# Patient Record
Sex: Male | Born: 1975 | Race: Black or African American | Hispanic: No | Marital: Single | Smoking: Current every day smoker
Health system: Southern US, Community
[De-identification: ages and names within clinical notes are randomized; demographics above are authoritative.]

## PROBLEM LIST (undated history)

## (undated) DIAGNOSIS — B20 Human immunodeficiency virus [HIV] disease: Secondary | ICD-10-CM

## (undated) HISTORY — PX: ANKLE SURGERY: SHX546

---

## 1999-08-10 ENCOUNTER — Emergency Department (HOSPITAL_COMMUNITY): Admission: EM | Admit: 1999-08-10 | Discharge: 1999-08-10 | Payer: Self-pay | Admitting: Emergency Medicine

## 2006-03-20 ENCOUNTER — Emergency Department (HOSPITAL_COMMUNITY): Admission: EM | Admit: 2006-03-20 | Discharge: 2006-03-20 | Payer: Self-pay | Admitting: Emergency Medicine

## 2006-03-22 ENCOUNTER — Emergency Department (HOSPITAL_COMMUNITY): Admission: EM | Admit: 2006-03-22 | Discharge: 2006-03-22 | Payer: Self-pay | Admitting: Diagnostic Radiology

## 2006-06-26 ENCOUNTER — Emergency Department (HOSPITAL_COMMUNITY): Admission: EM | Admit: 2006-06-26 | Discharge: 2006-06-26 | Payer: Self-pay | Admitting: Emergency Medicine

## 2006-09-19 ENCOUNTER — Emergency Department (HOSPITAL_COMMUNITY): Admission: EM | Admit: 2006-09-19 | Discharge: 2006-09-19 | Payer: Self-pay | Admitting: Emergency Medicine

## 2007-08-24 ENCOUNTER — Emergency Department (HOSPITAL_COMMUNITY): Admission: EM | Admit: 2007-08-24 | Discharge: 2007-08-24 | Payer: Self-pay | Admitting: Emergency Medicine

## 2007-12-03 ENCOUNTER — Emergency Department (HOSPITAL_COMMUNITY): Admission: EM | Admit: 2007-12-03 | Discharge: 2007-12-03 | Payer: Self-pay | Admitting: Emergency Medicine

## 2008-03-15 ENCOUNTER — Emergency Department (HOSPITAL_COMMUNITY): Admission: EM | Admit: 2008-03-15 | Discharge: 2008-03-15 | Payer: Self-pay | Admitting: Emergency Medicine

## 2008-05-20 ENCOUNTER — Emergency Department (HOSPITAL_COMMUNITY): Admission: EM | Admit: 2008-05-20 | Discharge: 2008-05-20 | Payer: Self-pay | Admitting: Emergency Medicine

## 2008-06-27 ENCOUNTER — Emergency Department (HOSPITAL_COMMUNITY): Admission: EM | Admit: 2008-06-27 | Discharge: 2008-06-27 | Payer: Self-pay | Admitting: Emergency Medicine

## 2008-08-24 ENCOUNTER — Emergency Department (HOSPITAL_COMMUNITY): Admission: EM | Admit: 2008-08-24 | Discharge: 2008-08-25 | Payer: Self-pay | Admitting: Internal Medicine

## 2008-08-27 ENCOUNTER — Emergency Department (HOSPITAL_COMMUNITY): Admission: EM | Admit: 2008-08-27 | Discharge: 2008-08-27 | Payer: Self-pay | Admitting: Emergency Medicine

## 2008-10-13 ENCOUNTER — Emergency Department (HOSPITAL_COMMUNITY): Admission: EM | Admit: 2008-10-13 | Discharge: 2008-10-13 | Payer: Self-pay | Admitting: Emergency Medicine

## 2010-07-27 ENCOUNTER — Emergency Department (HOSPITAL_COMMUNITY): Payer: Self-pay

## 2010-07-27 ENCOUNTER — Inpatient Hospital Stay (HOSPITAL_COMMUNITY)
Admission: EM | Admit: 2010-07-27 | Discharge: 2010-07-29 | DRG: 494 | Disposition: A | Discharge status: Home / Self Care | Payer: No Typology Code available for payment source | Attending: Orthopedic Surgery | Admitting: Orthopedic Surgery

## 2010-07-27 DIAGNOSIS — Z7982 Long term (current) use of aspirin: Secondary | ICD-10-CM

## 2010-07-27 DIAGNOSIS — W138XXA Fall from, out of or through other building or structure, initial encounter: Secondary | ICD-10-CM | POA: Diagnosis present

## 2010-07-27 DIAGNOSIS — F1210 Cannabis abuse, uncomplicated: Secondary | ICD-10-CM | POA: Diagnosis present

## 2010-07-27 DIAGNOSIS — F17200 Nicotine dependence, unspecified, uncomplicated: Secondary | ICD-10-CM | POA: Diagnosis present

## 2010-07-27 DIAGNOSIS — S93439A Sprain of tibiofibular ligament of unspecified ankle, initial encounter: Secondary | ICD-10-CM | POA: Diagnosis present

## 2010-07-27 DIAGNOSIS — Z21 Asymptomatic human immunodeficiency virus [HIV] infection status: Secondary | ICD-10-CM | POA: Diagnosis present

## 2010-07-27 DIAGNOSIS — Y9289 Other specified places as the place of occurrence of the external cause: Secondary | ICD-10-CM

## 2010-07-27 DIAGNOSIS — S8253XA Displaced fracture of medial malleolus of unspecified tibia, initial encounter for closed fracture: Principal | ICD-10-CM | POA: Diagnosis present

## 2010-07-27 DIAGNOSIS — F1010 Alcohol abuse, uncomplicated: Secondary | ICD-10-CM | POA: Diagnosis present

## 2010-07-27 LAB — URINALYSIS, ROUTINE W REFLEX MICROSCOPIC
Bilirubin Urine: NEGATIVE
Glucose, UA: NEGATIVE mg/dL
Hgb urine dipstick: NEGATIVE
Ketones, ur: NEGATIVE mg/dL
Nitrite: NEGATIVE
Protein, ur: 100 mg/dL — AB
Specific Gravity, Urine: 1.013 (ref 1.005–1.030)
Urobilinogen, UA: 0.2 mg/dL (ref 0.0–1.0)
pH: 6 (ref 5.0–8.0)

## 2010-07-27 LAB — RAPID URINE DRUG SCREEN, HOSP PERFORMED
Amphetamines: NOT DETECTED
Barbiturates: NOT DETECTED
Benzodiazepines: NOT DETECTED
Cocaine: NOT DETECTED
Opiates: NOT DETECTED
Tetrahydrocannabinol: POSITIVE — AB

## 2010-07-27 LAB — CBC
HCT: 37.2 % — ABNORMAL LOW (ref 39.0–52.0)
Hemoglobin: 13.3 g/dL (ref 13.0–17.0)
MCH: 31.6 pg (ref 26.0–34.0)
MCHC: 35.8 g/dL (ref 30.0–36.0)
MCV: 88.4 fL (ref 78.0–100.0)
Platelets: 243 K/uL (ref 150–400)
RBC: 4.21 MIL/uL — ABNORMAL LOW (ref 4.22–5.81)
RDW: 12.9 % (ref 11.5–15.5)
WBC: 5.9 K/uL (ref 4.0–10.5)

## 2010-07-27 LAB — POCT I-STAT, CHEM 8
BUN: 6 mg/dL (ref 6–23)
Calcium, Ion: 1.12 mmol/L (ref 1.12–1.32)
Creatinine, Ser: 1.3 mg/dL (ref 0.4–1.5)
Glucose, Bld: 104 mg/dL — ABNORMAL HIGH (ref 70–99)
Sodium: 142 mEq/L (ref 135–145)
TCO2: 21 mmol/L (ref 0–100)

## 2010-07-27 LAB — COMPREHENSIVE METABOLIC PANEL
ALT: 21 U/L (ref 0–53)
AST: 31 U/L (ref 0–37)
CO2: 22 mEq/L (ref 19–32)
Calcium: 9.5 mg/dL (ref 8.4–10.5)
Chloride: 102 mEq/L (ref 96–112)
Creatinine, Ser: 0.99 mg/dL (ref 0.4–1.5)
GFR calc non Af Amer: >60 mL/min (ref >60)
Total Bilirubin: 0.4 mg/dL (ref 0.3–1.2)

## 2010-07-27 LAB — ABO/RH: ABO/RH(D): A POS

## 2010-07-27 LAB — TYPE AND SCREEN: Antibody Screen: NEGATIVE

## 2010-07-27 LAB — URINE MICROSCOPIC-ADD ON

## 2010-07-27 LAB — PROTIME-INR: Prothrombin Time: 13.3 seconds (ref 11.6–15.2)

## 2010-07-27 LAB — ETHANOL: Alcohol, Ethyl (B): 138 mg/dL — ABNORMAL HIGH (ref 0–10)

## 2010-07-27 LAB — LACTIC ACID, PLASMA: Lactic Acid, Venous: 4.9 mmol/L — ABNORMAL HIGH (ref 0.5–2.2)

## 2010-07-28 LAB — BASIC METABOLIC PANEL
CO2: 28 mEq/L (ref 19–32)
Calcium: 9.3 mg/dL (ref 8.4–10.5)
Creatinine, Ser: 0.74 mg/dL (ref 0.4–1.5)
GFR calc Af Amer: >60 mL/min (ref >60)
GFR calc non Af Amer: >60 mL/min (ref >60)
Glucose, Bld: 100 mg/dL — ABNORMAL HIGH (ref 70–99)

## 2010-07-28 LAB — CBC
MCH: 31.1 pg (ref 26.0–34.0)
RDW: 13 % (ref 11.5–15.5)
WBC: 7.4 10*3/uL (ref 4.0–10.5)

## 2010-07-28 LAB — SURGICAL PCR SCREEN: Staphylococcus aureus: POSITIVE — AB

## 2010-07-29 NOTE — H&P (Signed)
  NAMEKEKAI, GETER NO.:  [id number]  MEDICAL RECORD NO.:  [id number]           PATIENT TYPE:  I  LOCATION:  5019                         FACILITY:  [facility]  PHYSICIAN:  Madlyn Frankel. Charlann Boxer, M.D.  DATE OF BIRTH:  15-Oct-1975  DATE OF ADMISSION:  07/27/2010 DATE OF DISCHARGE:                             HISTORY & PHYSICAL   ADMISSION DIAGNOSIS:  Right ankle fracture dislocation.  ADMITTING HISTORY:  Mr. Geiger is a 35 year old male with HIV positive who was out tonight.  He apparently slipped on something while on an overpass, stumbled and fell off, reportedly fallen about 20 feet.  He landed on his feet apparently with complaints of ankle pain predominantly.  He did not have any significant head injury.  No loss of conscious.  He had obvious deformity.  EMS brought him to the emergency room for evaluation.  He reports having only just a couple of drinks.  PAST MEDICAL HISTORY:  HIV positive.  FAMILY HISTORY:  Negative for diabetes, hypertension.  SURGICAL HISTORY:  Negative.  SOCIAL HISTORY:  Positive for tobacco use, cannabis use, and occasional alcohol use.  DRUG ALLERGIES:  SHELLFISH and IODINE.  CURRENT MEDICATIONS:  Bactrim, Valtrex, Atripla.  REVIEW OF SYSTEMS:  Other than reported musculoskeletal issues, he has had no recent headache, dizziness, blurred vision, slurred speech, no neurologic complaints.  He has had no rhinitis, productive cough, hemoptysis.  No fevers, chills, or night sweats.  No chest pain.  No abdominal pain.  No burning, frequency in urination.  PHYSICAL EXAMINATION:  Mr. Nack was seen and evaluated in the emergency room.  At that time, he had had an attempted reduction of his ankle; however, he was in his splint.  Evaluating the postreduction films initially, noted that his ankle was still dislocated.  His splint was taken down.  He had small abrasion over the anterolateral right leg, but no cuts or abrasions around his  ankle.  He had palpable pulses and intact sensibility.  He seemed to tolerate his pain level.  At this point, it is fine.  His left lower extremity is normal.  No evidence of any injuries.  He had no pain in his knees or hips and no upper extremity pathology despite this fall.  Radiographs initially noted to have a significant ankle fracture and dislocation with probable syndesmotic disruption.  ASSESSMENT:  Closed right ankle fracture and dislocation.  PLAN:  In the emergency room, he had a repeat closed reduction over the right ankle with application of splint with follow up radiographs.  Plan will be to admit to the hospital and scheduled for surgery over the weekend.  He will need an open reduction and internal fixation of his right ankle.  Consent will be obtained reviewing the necessity of the procedure with him.  Questions were encouraged and answered.     Madlyn Frankel Charlann Boxer, M.D.     MDO/MEDQ  D:  07/27/2010  T:  07/27/2010  Job:  [id number]  Electronically Signed by Durene Romans M.D. on 07/29/2010 12:15:15 PM

## 2010-07-29 NOTE — Op Note (Signed)
[name], [name] NO.:  [id number]  MEDICAL RECORD NO.:  [id number]           PATIENT TYPE:  I  LOCATION:  5019                         FACILITY:  [facility]  PHYSICIAN:  Madlyn Frankel. Charlann Boxer, M.D.  DATE OF BIRTH:  10-24-1975  DATE OF PROCEDURE:  07/28/2010 DATE OF DISCHARGE:                              OPERATIVE REPORT   PREOPERATIVE DIAGNOSIS:  Right ankle fracture, dislocation with syndesmotic disruption.  POSTOPERATIVE DIAGNOSIS:  Right ankle fracture, dislocation with syndesmotic disruption.  PROCEDURES: 1. Open reduction and internal fixation of right ankle. 2. Open reduction internal rotation of syndesmotic injury utilizing     DePuy components.  SURGEON:  Madlyn Frankel. Charlann Boxer, MD  ASSISTANT:  Surgical team.  ANESTHESIA:  General LMA.  TOURNIQUET TIME:  85 minutes, 250 mmHg.  DRAINS:  None.  SPECIMENS:  None.  The patient was stable to recovery room.  INDICATION FOR THE PROCEDURE:  Leon Hall is a 35 year old male who injured his right ankle and was admitted to the hospital on July 27, 2010.  He was noted to have a fracture, dislocation of the right ankle with concern for disruption and syndesmosis.  He was planned for surgery on July 28, 2010.  Risks and benefits and necessity of fracture fixation were discussed as well as a postoperative course.  PROCEDURE IN DETAIL:  The patient was brought to the operative theater. Once adequate anesthesia, preoperative antibiotics, Ancef, administered, the patient was positioned supine with the right thigh tourniquet placed.  The right lower extremity was then prepped and draped in a sterile fashion utilizing noniodine-based products due to an IODINE allergy.  Time-out was performed identifying the patient, planned procedure, and extremity.  Leg was exsanguinated, tourniquet elevated to 250 mmHg.  Attention was first directed laterally.  The lateral-based incision was made and carried down sharply to the  lateral fibula. Fracture site was identified.  The patient was noted to have an oblique fracture pattern to the fibula with a posterior extension of a fragment. Utilizing clamps, the fracture was reduced to a near anatomic position. Based on the oblique nature, I at this point chose to use a lag screw technique.  With the fracture reduced, a 3.5 drill bit was first used on the anterior cortex in a direction perpendicular to fracture site.  A 2.7 drill bit was then passed through this to the posterior cortex.  I chose to use a 28-mm full cancellus screw to provide the lag.  With the fracture now reduced and held with a lag screw, a six-hole one- third tubular plate was chosen as a buttress to the fixation.  With the plate held against the fibula laterally, I placed two cancellous screws distal to the fracture pattern as well as three bicortical screws initially proximally.  The lateral fibula at this point was held anatomic.  At this point, attention was directed laterally.  A soft J-type incision was carried over the fracture site on the medial malleolus.  Fracture pattern was identified and noted some comminution along the medial aspect of the fracture site.  Under fluoroscopy, I have reduced the fracture to near anatomic  position.  Based on the type of the pattern that was present, as well as the size of bone, I was only able to get a single screw in.  I used a 2.7 drill bit and drilled through the apex of the fracture site across the fracture into the proximal medial tibia.  I chose a 45-mm partially threaded cancellous screw which was then passed through the fracture site holding the fracture into near anatomic position.  The medial comminuted segment was small enough it did not require fixation.  At this point, I assessed the syndesmosis.  I was worried about lateral subluxation of the talus as well as an anterior-posterior shuck of the fixed fibula.  For this reason, I opened  up a Israel clamp and held the ankle in a reduced fashion.  Based on the placement of the previous lag screw in the distal portion of the plate, I was only able to place a single screw, I chose to use a 4.0 fully cancellus screw.  With the syndesmosis reduced, I drilled with a 2.7 drill bit across the fibula and tibia.  I confirmed the positioning and placed a 48-mm screw.  The syndesmosis and fracture now are held in a near anatomic position.  Final pictures were obtained.  The wounds were irrigated with normal saline solution.  The lateral wound was closed in layers with the deeper layer held together with 2-0 Vicryl, subcu layer with 2-0 Vicryl, and then 2-0 nylon on the skin.  Medially, I reapproximated some of the periosteal tissue over the fracture site holding the medial comminuted segment into anatomic position.  The remaining wound was closed with 2-0 Vicryl and 2-0 nylon.  The wounds were then cleaned, dried, and dressed sterilely with an Adaptic and a bulky wrap, followed by the placement of plaster splint.  He was then extubated and brought to the recovery room in stable condition tolerating the procedure well.     Madlyn Frankel Charlann Boxer, M.D.     MDO/MEDQ  D:  07/28/2010  T:  07/28/2010  Job:  [id number]  Electronically Signed by Durene Romans M.D. on 07/29/2010 12:15:19 PM

## 2010-08-03 NOTE — Discharge Summary (Signed)
[name], HOCKEY NO.:  [id number]  MEDICAL RECORD NO.:  [id number]           PATIENT TYPE:  I  LOCATION:  5019                         FACILITY:  [facility]  PHYSICIAN:  Madlyn Frankel. Charlann Boxer, M.D.  DATE OF BIRTH:  1975/10/15  DATE OF ADMISSION:  07/27/2010 DATE OF DISCHARGE:  07/29/2010                              DISCHARGE SUMMARY   ADMITTING DIAGNOSIS:  Right ankle fracture dislocation with syndesmotic disruption.  DISCHARGE DIAGNOSES: 1. Right ankle fracture dislocation with syndesmotic disruption status     post open reduction and internal fixation. 2. Human immunodeficiency virus positive.  BRIEF HISTORY:  Mr. Leon Hall is a 35 year old male who injured his right ankle with an uncertain history as it most of it changed a few different times, but it may involve slipping on a glass bottle, may or may not have involved falling over past 20 feet and nonetheless, his only injury was determined to be his ankle fracture.  This is a closed fracture dislocation of his right ankle.  He was seen and evaluated in the emergency room, admitted to the hospital due to the temple nature of it, kept comfortable overnight and planned for surgery on July 28, 2010.  HOSPITAL COURSE:  The patient was admitted to the hospital on July 27, 2010, for his right ankle fracture dislocation.  He was seen and evaluated in the emergency room where his fracture was closed, reduced and placed in splint.  Consent was obtained for surgery based on risks and benefits and necessity of the surgery is reviewed.  On hospital day 2 or July 28, 2010, he underwent an open reduction and internal fixation of his right ankle and open reduction and internal fixation of syndesmosis.  Please see the dictated operative note for full details of the procedure.  Postoperatively, he was transferred to the recovery room and then the orthopedic ward where he remained for his overnight stay.  Mr. Crunkleton seems to  tolerate pain pretty well and by postop day #1, was seen and evaluated by Therapy and cleared on crutches.  He was awaiting a repeat effort with therapy to work on stairs, but otherwise he is ready for discharge.  DISCHARGE INSTRUCTIONS:  The patient will be discharged to home.  He will be nonweightbearing on the right lower extremity for 6 weeks is reviewed.  He will keep his splint dry without getting wet until followup in 2 weeks.  He will most likely be transitioned from the splint to a short-leg cast.  DISCHARGE MEDICATIONS:  Include his home medications of: 1. Valtrex 1 gram nightly. 2. Bactrim DS nightly. 3. Atripla 1 tablet daily.  In addition, I gave him a prescription for: 1. Hydrocodone 5/325 one-two tablets every 4-6 hours as needed for     pain. 2. Robaxin 500 mg every 6 hours as needed for pain. 3. Aspirin 325 mg, he will take for 30 days. 4. Colace 100 mg p.o. b.i.d. as needed for constipation while on pain     meds.  If he has any questions and also to arrange for orthopedic followup with Dr. Durene Romans at [facility], office number [phone].     Madlyn Frankel Charlann Boxer, M.D.     MDO/MEDQ  D:  07/29/2010  T:  07/30/2010  Job:  [id number]  Electronically Signed by Durene Romans M.D. on 08/03/2010 09:59:27 AM

## 2011-01-28 LAB — URINALYSIS, ROUTINE W REFLEX MICROSCOPIC
Glucose, UA: NEGATIVE
Ketones, ur: NEGATIVE
pH: 7

## 2011-01-28 LAB — COMPREHENSIVE METABOLIC PANEL
Alkaline Phosphatase: 81
BUN: 3 — ABNORMAL LOW
CO2: 28
Chloride: 104
GFR calc non Af Amer: >60
Glucose, Bld: 86
Potassium: 3.9
Total Bilirubin: 0.7
Total Protein: 7.4

## 2011-01-28 LAB — CBC
HCT: 43.5
Hemoglobin: 14.9
RDW: 12.6

## 2011-01-28 LAB — DIFFERENTIAL
Basophils Absolute: 0.3 — ABNORMAL HIGH
Basophils Relative: 3 — ABNORMAL HIGH
Neutro Abs: 5.8
Neutrophils Relative %: 71

## 2011-01-28 LAB — LIPASE, BLOOD: Lipase: 22

## 2011-05-09 ENCOUNTER — Encounter (HOSPITAL_COMMUNITY): Payer: Self-pay

## 2011-05-09 ENCOUNTER — Emergency Department (HOSPITAL_COMMUNITY)
Admission: EM | Admit: 2011-05-09 | Discharge: 2011-05-09 | Disposition: A | Discharge status: Home / Self Care | Payer: Self-pay | Attending: Emergency Medicine | Admitting: Emergency Medicine

## 2011-05-09 DIAGNOSIS — L0201 Cutaneous abscess of face: Secondary | ICD-10-CM | POA: Insufficient documentation

## 2011-05-09 DIAGNOSIS — Z21 Asymptomatic human immunodeficiency virus [HIV] infection status: Secondary | ICD-10-CM | POA: Insufficient documentation

## 2011-05-09 DIAGNOSIS — L03211 Cellulitis of face: Secondary | ICD-10-CM | POA: Insufficient documentation

## 2011-05-09 DIAGNOSIS — K029 Dental caries, unspecified: Secondary | ICD-10-CM | POA: Insufficient documentation

## 2011-05-09 HISTORY — DX: Human immunodeficiency virus (HIV) disease: B20

## 2011-05-09 MED ORDER — DOXYCYCLINE HYCLATE 100 MG PO CAPS: 100.0000 mg | ORAL_CAPSULE | Freq: Two times a day (BID) | ORAL | Status: AC

## 2011-05-09 NOTE — ED Provider Notes (Signed)
Medical screening examination/treatment/procedure(s) were performed by non-physician practitioner and as supervising physician I was immediately available for consultation/collaboration.  Doug Sou, MD 05/09/11 2150

## 2011-05-09 NOTE — ED Provider Notes (Signed)
History     CSN: [id number]  Arrival date & time 05/09/11  1406   First MD Initiated Contact with Patient 05/09/11 1630      Chief Complaint  Patient presents with  . Recurrent Skin Infections    boil to RT cheek x 1 year, comes and goes.      (Consider location/radiation/quality/duration/timing/severity/associated sxs/prior Treatment) Patient is a 36 y.o. male presenting with abscess. The history is provided by the patient.  Abscess  This is a recurrent problem. The current episode started more than one week ago. The onset was gradual. The problem occurs occasionally. The problem has been gradually worsening. Affected Location: right cheek. The problem is mild. The abscess is characterized by redness and swelling. Pertinent negatives include no fever and no sore throat.   patient reports a draining skin wound to the same location approximately 1 year ago that resolved and has gradually worsened in the last several months, though he describes a rapid worsening in the last week. History of abscesses to the axilla. Denies similar lesions to any other part of the body at this time.  Past Medical History  Diagnosis Date  . HIV disease     Past Surgical History  Procedure Date  . Ankle surgery     RT ankle    History reviewed. No pertinent family history.  History  Substance Use Topics  . Smoking status: Current Everyday Smoker -- 0.2 packs/day    Types: Cigarettes  . Smokeless tobacco: Not on file  . Alcohol Use: Yes     occasionally      Review of Systems  Constitutional: Negative for fever and chills.  HENT: Negative for sore throat, trouble swallowing, neck pain and neck stiffness.        Negative for tooth pain. Negative for mouth lesion.  Eyes: Negative for pain and visual disturbance.  Skin: Positive for wound. Negative for color change and rash.  Neurological: Negative for dizziness and headaches.    Allergies  Shellfish allergy  Home Medications    Current Outpatient Rx  Name Route Sig Dispense Refill  . EFAVIRENZ-EMTRICITAB-TENOFOVIR 600-200-300 MG PO TABS Oral Take 1 tablet by mouth at bedtime.    Marland Kitchen VALACYCLOVIR HCL 1 G PO TABS Oral Take 1,000 mg by mouth daily.      BP 125/87  Pulse 71  Temp(Src) 98.8 F (37.1 C) (Oral)  Resp 16  SpO2 100%  Physical Exam  Nursing note and vitals reviewed. Constitutional: He is oriented to person, place, and time. He appears well-developed and well-nourished. No distress.  HENT:  Head: Normocephalic and atraumatic.  Right Ear: External ear normal.  Left Ear: External ear normal.  Mouth/Throat: Oropharynx is clear and moist and mucous membranes are normal. Dental caries present. No dental abscesses or uvula swelling.       Teeth in poor repair. Oral mucosa pink and moist without lesions. See skin exam  Eyes: Pupils are equal, round, and reactive to light.  Neck: Normal range of motion. Neck supple.  Cardiovascular: Normal rate and regular rhythm.   Pulmonary/Chest:       Normal respiratory effort and excursion  Abdominal: Soft. He exhibits no distension. There is no tenderness.  Musculoskeletal: Normal range of motion. He exhibits no edema and no tenderness.  Neurological: He is alert and oriented to person, place, and time.  Skin: Skin is warm and dry. No rash noted.       1.5cm abscess to right upper cheek with erythema, induration,  2mm area of fluctuance to superior aspect. Mildly TTP. No surrounding cellulitis.  Psychiatric: He has a normal mood and affect.    ED Course  INCISION AND DRAINAGE Date/Time: 05/09/2011 5:15 PM Performed by: Lorenz Coaster JUSTICE Authorized by: Lorenz Coaster JUSTICE Consent: Verbal consent obtained. Risks and benefits: risks, benefits and alternatives were discussed Consent given by: patient Patient understanding: patient states understanding of the procedure being performed Patient consent: the patient's understanding of the procedure  matches consent given Relevant documents: relevant documents present and verified Site marked: the operative site was marked Patient identity confirmed: verbally with patient Time out: Immediately prior to procedure a "time out" was called to verify the correct patient, procedure, equipment, support staff and site/side marked as required. Type: abscess Body area: head/neck (right cheek) Anesthesia method: no anesthesia used. Patient sedated: no Needle gauge: 18 Drainage: purulent Drainage amount: moderate Wound treatment: wound left open Packing material: none Patient tolerance: Patient tolerated the procedure well with no immediate complications.   (including critical care time)  Labs Reviewed - No data to display No results found.   Dx 1: abscess, face   MDM  Needle drainage of abscess to right cheek with purulent drainage. Given immunocompromised status of pt, will place on abx and have advised warm compresses several times a day as no packing used. Pt instructed to f/u with pcp        Shaaron Adler, PA 05/09/11 1744

## 2012-08-04 ENCOUNTER — Encounter (HOSPITAL_COMMUNITY): Payer: Self-pay | Admitting: Emergency Medicine

## 2012-08-04 ENCOUNTER — Emergency Department (HOSPITAL_COMMUNITY)
Admission: EM | Admit: 2012-08-04 | Discharge: 2012-08-04 | Disposition: A | Discharge status: Home / Self Care | Payer: Self-pay | Attending: Emergency Medicine | Admitting: Emergency Medicine

## 2012-08-04 ENCOUNTER — Emergency Department (HOSPITAL_COMMUNITY): Payer: Self-pay

## 2012-08-04 DIAGNOSIS — J069 Acute upper respiratory infection, unspecified: Secondary | ICD-10-CM

## 2012-08-04 DIAGNOSIS — R1110 Vomiting, unspecified: Secondary | ICD-10-CM | POA: Insufficient documentation

## 2012-08-04 DIAGNOSIS — R05 Cough: Secondary | ICD-10-CM

## 2012-08-04 DIAGNOSIS — R071 Chest pain on breathing: Secondary | ICD-10-CM | POA: Insufficient documentation

## 2012-08-04 DIAGNOSIS — R059 Cough, unspecified: Secondary | ICD-10-CM

## 2012-08-04 DIAGNOSIS — R61 Generalized hyperhidrosis: Secondary | ICD-10-CM | POA: Insufficient documentation

## 2012-08-04 DIAGNOSIS — B20 Human immunodeficiency virus [HIV] disease: Secondary | ICD-10-CM | POA: Insufficient documentation

## 2012-08-04 DIAGNOSIS — Z79899 Other long term (current) drug therapy: Secondary | ICD-10-CM | POA: Insufficient documentation

## 2012-08-04 DIAGNOSIS — F17200 Nicotine dependence, unspecified, uncomplicated: Secondary | ICD-10-CM | POA: Insufficient documentation

## 2012-08-04 MED ORDER — HYDROCODONE-HOMATROPINE 5-1.5 MG/5ML PO SYRP: 5.0000 mL | ORAL_SOLUTION | Freq: Four times a day (QID) | ORAL | Status: DC | PRN

## 2012-08-04 MED ORDER — HYDROCOD POLST-CHLORPHEN POLST 10-8 MG/5ML PO LQCR
5.0000 mL | Freq: Once | ORAL | Status: AC
  Administered 2012-08-04: 5 mL via ORAL
  Filled 2012-08-04: qty 5

## 2012-08-04 NOTE — ED Provider Notes (Signed)
History    This chart was scribed for non-physician practitioner, Jaci Carrel working with Nelia Shi, MD by Smitty Pluck, ED scribe. This patient was seen in room WTR5/WTR5 and the patient's care was started at 4:43 PM.   CSN: [id number]  Arrival date & time 08/04/12  1516      Chief Complaint  Patient presents with  . Cough      The history is provided by the patient. No language interpreter was used.   Leon Hall is a 37 y.o. male with hx of HIV who presents to the Emergency Department complaining of cough. He reports having productive cough with clear sputum and  with post tussive regurgitation onset 1 week ago. He mentions that breathing deeply aggravates the chest wall pain. Associated symptoms include night sweats and chest wall tenderness. Pt denies coughing up blood, fever, chills, nausea, vomiting, diarrhea, weakness, congestion, pain swallowing and any other pain. Pt reports that he smokes cigarettes and marijuana. Pt is unsure of his last CD4 count, reports medication compliance.   ID doctor: Cathlean Sauer fores   Past Medical History  Diagnosis Date  . HIV disease     Past Surgical History  Procedure Laterality Date  . Ankle surgery      RT ankle    No family history on file.  History  Substance Use Topics  . Smoking status: Current Every Day Smoker -- 0.20 packs/day    Types: Cigarettes  . Smokeless tobacco: Not on file  . Alcohol Use: Yes     Comment: occasionally      Review of Systems  Constitutional: Positive for diaphoresis. Negative for fever and chills.  Respiratory: Positive for cough and shortness of breath.   Cardiovascular: Positive for chest pain.  Gastrointestinal: Negative for nausea and vomiting.  Neurological: Negative for weakness.  All other systems reviewed and are negative.    Allergies  Shellfish allergy  Home Medications   Current Outpatient Rx  Name  Route  Sig  Dispense  Refill  .  efavirenz-emtrictabine-tenofovir (ATRIPLA) 600-200-300 MG per tablet   Oral   Take 1 tablet by mouth at bedtime.         . valACYclovir (VALTREX) 1000 MG tablet   Oral   Take 1,000 mg by mouth daily.           BP 112/87  Pulse 100  Temp(Src) 99.1 F (37.3 C) (Oral)  Resp 16  SpO2 100%  Physical Exam  Nursing note and vitals reviewed. Constitutional: He is oriented to person, place, and time. He appears well-developed and well-nourished. No distress.  HENT:  Head: Normocephalic and atraumatic.  Poor dentition. Pharynx clear and moist without erythema or exudate  Eyes: Conjunctivae and EOM are normal.  Neck: Normal range of motion.  Cardiovascular:  RRR  Pulmonary/Chest: Effort normal.  LCAB, no distress. Mild chest wall tenderness  Abdominal:  Soft nontender  Musculoskeletal: Normal range of motion.  Neurological: He is alert and oriented to person, place, and time.  Skin: Skin is warm and dry. No rash noted. He is not diaphoretic.  Psychiatric: He has a normal mood and affect. His behavior is normal.    ED Course  Procedures (including critical care time) DIAGNOSTIC STUDIES: Oxygen Saturation is 100% on room air, normal by my interpretation.    COORDINATION OF CARE: 4:47 PM Discussed ED treatment with pt and pt agrees to chest xray.  4:51 PM Ordered:  Medications  chlorpheniramine-HYDROcodone (TUSSIONEX) 10-8 MG/5ML suspension 5 mL (  not administered)       Labs Reviewed - No data to display Dg Chest 2 View  08/04/2012  *RADIOLOGY REPORT*  Clinical Data: Cough and fever  CHEST - 2 VIEW  Comparison: Portal chest 07/27/2010  Findings: The heart size is normal.  The lungs are clear.  The visualized soft tissues and bony thorax are unremarkable.  IMPRESSION: Negative two-view chest.   Original Report Authenticated By: Marin Roberts, M.D.      No diagnosis found.    MDM  37 yo HIV pt (compliant w medication, unknown last CD4)  that is a current  everyday smoker presents to hospital c/o cough onset 1 wk ago. Pt CXR negative for acute infiltrate. Patients symptoms are consistent with URI, likely viral etiology. Discussed that antibiotics are not indicated for viral infections. Pt will be discharged with symptomatic treatment.  Verbalizes understanding and is agreeable with plan. Pt is hemodynamically stable & in NAD prior to dc. Advised ID follow up. Strict return precautions discussed.        I personally performed the services described in this documentation, which was scribed in my presence. The recorded information has been reviewed and is accurate.      Jaci Carrel, [place] 08/04/12 1750

## 2012-08-04 NOTE — ED Notes (Signed)
Pt states that he has been having a dry cough since Sunday.  C/o gen body aches and rib pain from coughing.

## 2012-08-05 NOTE — ED Provider Notes (Signed)
Medical screening examination/treatment/procedure(s) were performed by non-physician practitioner and as supervising physician I was immediately available for consultation/collaboration.   Sanaa Zilberman L Antuane Eastridge, MD 08/05/12 1307 

## 2012-08-06 ENCOUNTER — Encounter (HOSPITAL_COMMUNITY): Payer: Self-pay | Admitting: Emergency Medicine

## 2012-08-06 ENCOUNTER — Emergency Department (HOSPITAL_COMMUNITY)
Admission: EM | Admit: 2012-08-06 | Discharge: 2012-08-06 | Disposition: A | Discharge status: Home / Self Care | Payer: Self-pay | Attending: Emergency Medicine | Admitting: Emergency Medicine

## 2012-08-06 DIAGNOSIS — R0602 Shortness of breath: Secondary | ICD-10-CM | POA: Insufficient documentation

## 2012-08-06 DIAGNOSIS — Z21 Asymptomatic human immunodeficiency virus [HIV] infection status: Secondary | ICD-10-CM | POA: Insufficient documentation

## 2012-08-06 DIAGNOSIS — F17200 Nicotine dependence, unspecified, uncomplicated: Secondary | ICD-10-CM | POA: Insufficient documentation

## 2012-08-06 DIAGNOSIS — R071 Chest pain on breathing: Secondary | ICD-10-CM | POA: Insufficient documentation

## 2012-08-06 DIAGNOSIS — R05 Cough: Secondary | ICD-10-CM | POA: Insufficient documentation

## 2012-08-06 DIAGNOSIS — Z79899 Other long term (current) drug therapy: Secondary | ICD-10-CM | POA: Insufficient documentation

## 2012-08-06 DIAGNOSIS — K029 Dental caries, unspecified: Secondary | ICD-10-CM | POA: Insufficient documentation

## 2012-08-06 DIAGNOSIS — R059 Cough, unspecified: Secondary | ICD-10-CM | POA: Insufficient documentation

## 2012-08-06 DIAGNOSIS — R062 Wheezing: Secondary | ICD-10-CM | POA: Insufficient documentation

## 2012-08-06 MED ORDER — IPRATROPIUM BROMIDE 0.02 % IN SOLN
0.5000 mg | Freq: Once | RESPIRATORY_TRACT | Status: AC
  Administered 2012-08-06: 0.5 mg via RESPIRATORY_TRACT
  Filled 2012-08-06: qty 2.5

## 2012-08-06 MED ORDER — ALBUTEROL SULFATE HFA 108 (90 BASE) MCG/ACT IN AERS: 1.0000 | INHALATION_SPRAY | Freq: Four times a day (QID) | RESPIRATORY_TRACT | Status: AC | PRN

## 2012-08-06 MED ORDER — ALBUTEROL SULFATE (5 MG/ML) 0.5% IN NEBU
5.0000 mg | INHALATION_SOLUTION | Freq: Once | RESPIRATORY_TRACT | Status: AC
  Administered 2012-08-06: 5 mg via RESPIRATORY_TRACT
  Filled 2012-08-06: qty 1

## 2012-08-06 MED ORDER — PREDNISONE 20 MG PO TABS: 40.0000 mg | ORAL_TABLET | Freq: Every day | ORAL | Status: DC

## 2012-08-06 MED ORDER — PREDNISONE 20 MG PO TABS
60.0000 mg | ORAL_TABLET | Freq: Once | ORAL | Status: AC
  Administered 2012-08-06: 60 mg via ORAL
  Filled 2012-08-06: qty 3

## 2012-08-06 NOTE — ED Provider Notes (Signed)
Medical screening examination/treatment/procedure(s) were performed by non-physician practitioner and as supervising physician I was immediately available for consultation/collaboration.  Juliet Rude. Rubin Payor, MD 08/06/12 2126

## 2012-08-06 NOTE — ED Notes (Signed)
Patient seen here on Wed and was diagnosed with upper respiratory infection.  Patient is keeping down fluids, but is having nausea, coughing.  Denies sore throat and fever.  Patient was put on cough medicine but no antibiotic.  Pt has history of HIV.  No chest discomfort now.

## 2012-08-06 NOTE — ED Provider Notes (Signed)
History     CSN: [id number]  Arrival date & time 08/06/12  1730   First MD Initiated Contact with Patient 08/06/12 1752      Chief Complaint  Patient presents with  . Cough    (Consider location/radiation/quality/duration/timing/severity/associated sxs/prior treatment) HPI Comments: Pt presents to the ED for cough and left sided rib pain.  Pt was seen in the ED 08/04/12 and dx with URI.  Continues to have similar sx of non-productive cough, intermittent SOB, and left sided rib pain.  Pain and SOB exacerbated by heavy bouts of coughing.  CXR from 4/9 without evidence of pneumonia.  No recent sick contacts.  Pt hx of HIV, followed at [facility].  Unsure of CD4 count at this time but has been taking his medications.  Denies any chest pain, abdominal pain, fevers, sweats, chills, diarrhea, dysuria, or hematochezia.  Has been taking the prescribed cough medication without significant improvement of sx.  The history is provided by the patient.    Past Medical History  Diagnosis Date  . HIV disease     Past Surgical History  Procedure Laterality Date  . Ankle surgery      RT ankle    No family history on file.  History  Substance Use Topics  . Smoking status: Current Every Day Smoker -- 0.20 packs/day    Types: Cigarettes  . Smokeless tobacco: Not on file  . Alcohol Use: Yes     Comment: occasionally      Review of Systems  Respiratory: Positive for cough, shortness of breath and wheezing.   All other systems reviewed and are negative.    Allergies  Shellfish allergy  Home Medications   Current Outpatient Rx  Name  Route  Sig  Dispense  Refill  . Aspirin-Salicylamide-Caffeine (BC HEADACHE POWDER PO)   Oral   Take 1 packet by mouth daily as needed (for pain.).         Marland Kitchen efavirenz-emtrictabine-tenofovir (ATRIPLA) 600-200-300 MG per tablet   Oral   Take 1 tablet by mouth at bedtime.         Marland Kitchen HYDROcodone-homatropine (HYCODAN) 5-1.5 MG/5ML syrup   Oral   Take 5 mLs  by mouth every 6 (six) hours as needed for cough.   120 mL   0   . Phenylephrine-DM-GG (ROBAFEN CF COUGH/COLD) 5-10-100 MG/5ML SYRP   Oral   Take 15 mLs by mouth 2 (two) times daily as needed (for cough.).         Marland Kitchen valACYclovir (VALTREX) 1000 MG tablet   Oral   Take 1,000 mg by mouth at bedtime.            BP 126/81  Pulse 72  Temp(Src) 98.1 F (36.7 C) (Oral)  Wt 150 lb (68.04 kg)  SpO2 96%  Physical Exam  Nursing note and vitals reviewed. Constitutional: He is oriented to person, place, and time. He appears well-developed and well-nourished.  HENT:  Head: Normocephalic and atraumatic.  Mouth/Throat: Uvula is midline, oropharynx is clear and moist and mucous membranes are normal. Abnormal dentition. Dental caries present. No edematous. No oropharyngeal exudate, posterior oropharyngeal edema, posterior oropharyngeal erythema or tonsillar abscesses.  Eyes: Conjunctivae and EOM are normal. Pupils are equal, round, and reactive to light.  Neck: Normal range of motion.  Cardiovascular: Normal rate, regular rhythm and normal heart sounds.   Pulmonary/Chest: Effort normal. He has wheezes (bilateral bases). He has no rhonchi.  Mild left posterior rib tenderness  Abdominal: Soft. Bowel sounds are normal.  There is no tenderness. There is no CVA tenderness, no tenderness at McBurney's point and negative Murphy's sign.  Musculoskeletal: Normal range of motion.  Neurological: He is alert and oriented to person, place, and time.  Skin: Skin is warm and dry.  Psychiatric: He has a normal mood and affect.    ED Course  Procedures (including critical care time)  Labs Reviewed - No data to display No results found.   1. Cough   2. Wheezing       MDM   Pt presents to the ED for cough and intermittent SOB.  Seen 4/9 for the same without improvement of sx.  CXR at that time was negative for pneumonia.    Good resolution of wheezing with albuterol/atrovent neb and prednisone.   Pt in NAD, VS stable.  Rx prednisone and albuterol inhaler.  Keep previously scheduled FU appt with PCP at [facility].  Discussed plan with pt- he agreed.  Return precautions advised.       Garlon Hatchet, PA-C 08/06/12 1951  Garlon Hatchet, PA-C 08/06/12 1955

## 2012-08-06 NOTE — ED Notes (Signed)
Pt verbalized understanding of d/c instructions and medications. Ambulatory with steady gait to d/c window.

## 2013-06-25 ENCOUNTER — Emergency Department (HOSPITAL_COMMUNITY)
Admission: EM | Admit: 2013-06-25 | Discharge: 2013-06-25 | Disposition: A | Discharge status: Home / Self Care | Payer: No Typology Code available for payment source | Attending: Emergency Medicine | Admitting: Emergency Medicine

## 2013-06-25 ENCOUNTER — Encounter (HOSPITAL_COMMUNITY): Payer: Self-pay | Admitting: Emergency Medicine

## 2013-06-25 DIAGNOSIS — Z21 Asymptomatic human immunodeficiency virus [HIV] infection status: Secondary | ICD-10-CM | POA: Insufficient documentation

## 2013-06-25 DIAGNOSIS — IMO0002 Reserved for concepts with insufficient information to code with codable children: Secondary | ICD-10-CM | POA: Insufficient documentation

## 2013-06-25 DIAGNOSIS — Z79899 Other long term (current) drug therapy: Secondary | ICD-10-CM | POA: Insufficient documentation

## 2013-06-25 DIAGNOSIS — F17200 Nicotine dependence, unspecified, uncomplicated: Secondary | ICD-10-CM | POA: Insufficient documentation

## 2013-06-25 DIAGNOSIS — L089 Local infection of the skin and subcutaneous tissue, unspecified: Secondary | ICD-10-CM

## 2013-06-25 DIAGNOSIS — L723 Sebaceous cyst: Secondary | ICD-10-CM | POA: Insufficient documentation

## 2013-06-25 MED ORDER — HYDROCODONE-ACETAMINOPHEN 5-325 MG PO TABS: 1.0000 | ORAL_TABLET | ORAL | Status: AC | PRN

## 2013-06-25 NOTE — ED Provider Notes (Signed)
CSN: [id number]     Arrival date & time 06/25/13  1034 History   First MD Initiated Contact with Patient 06/25/13 1103     Chief Complaint  Patient presents with  . Abscess    l/axillary swelling x 4 weeks     (Consider location/radiation/quality/duration/timing/severity/associated sxs/prior Treatment) HPI  This is a 38 year old male with a past medical history of HIV who presents the emergency department with chief complaint of left axillary abscess.  Patient states he is a previous history of the same.  States he is 4 weeks of progressively enlarging left axillary bump.  He states that over the past week he became painful, tender, red and swollen.  Patient saw his PCP yesterday who prescribed Bactrim although he has not begun taking the medication.  Patient does not know the value of his viral load however he states that he does take his medications and he was told that they were good.  The patient denies fevers, chills, nausea, myalgias of her symptoms of systemic infection. Past Medical History  Diagnosis Date  . HIV disease    Past Surgical History  Procedure Laterality Date  . Ankle surgery      RT ankle   Family History  Problem Relation Age of Onset  . Hypertension Mother   . Diabetes Mother    History  Substance Use Topics  . Smoking status: Current Every Day Smoker -- 0.20 packs/day    Types: Cigarettes  . Smokeless tobacco: Not on file  . Alcohol Use: Yes     Comment: occasionally    Review of Systems  Ten systems reviewed and are negative for acute change, except as noted in the HPI.    Allergies  Shellfish allergy  Home Medications   Current Outpatient Rx  Name  Route  Sig  Dispense  Refill  . sulfamethoxazole-trimethoprim (BACTRIM DS) 800-160 MG per tablet      1 tablet.         Marland Kitchen. albuterol (PROVENTIL HFA;VENTOLIN HFA) 108 (90 BASE) MCG/ACT inhaler   Inhalation   Inhale 1-2 puffs into the lungs every 6 (six) hours as needed for wheezing.   1  Inhaler   0   . Aspirin-Salicylamide-Caffeine (BC HEADACHE POWDER PO)   Oral   Take 1 packet by mouth daily as needed (for pain.).         Marland Kitchen. efavirenz-emtrictabine-tenofovir (ATRIPLA) 600-200-300 MG per tablet   Oral   Take 1 tablet by mouth at bedtime.         Marland Kitchen. HYDROcodone-homatropine (HYCODAN) 5-1.5 MG/5ML syrup   Oral   Take 5 mLs by mouth every 6 (six) hours as needed for cough.   120 mL   0   . Phenylephrine-DM-GG (ROBAFEN CF COUGH/COLD) 5-10-100 MG/5ML SYRP   Oral   Take 15 mLs by mouth 2 (two) times daily as needed (for cough.).         Marland Kitchen. predniSONE (DELTASONE) 20 MG tablet   Oral   Take 2 tablets (40 mg total) by mouth daily.   10 tablet   0   . valACYclovir (VALTREX) 1000 MG tablet   Oral   Take 1,000 mg by mouth at bedtime.           BP 130/91  Pulse 80  Temp(Src) 98.3 F (36.8 C) (Oral)  Resp 18  Wt 145 lb (65.772 kg)  SpO2 100% Physical Exam Physical Exam  Nursing note and vitals reviewed. Constitutional: He appears well-developed and well-nourished. No distress.  HENT:  Head: Normocephalic and atraumatic.  Eyes: Conjunctivae normal are normal. No scleral icterus.  Neck: Normal range of motion. Neck supple.  Cardiovascular: Normal rate, regular rhythm and normal heart sounds.   Pulmonary/Chest: Effort normal and breath sounds normal. No respiratory distress.  Abdominal: Soft. There is no tenderness.  Musculoskeletal: He exhibits no edema.  Neurological: He is alert.  Skin: Skin is warm and dry. He is not diaphoretic.  4 cm left axillary abscess, red, tender, painful, fluctuant.  No surrounding cellulitis, streaking.   Psychiatric: His behavior is normal.    ED Course  Procedures (including critical care time) INCISION AND DRAINAGE Performed by: Arthor Captain Consent: Verbal consent obtained. Risks and benefits: risks, benefits and alternatives were discussed Type: abscess  Body area: Left axilla  Anesthesia: local  infiltration  Incision was made with a scalpel.  Local anesthetic: lidocaine 2 % with epinephrine  Anesthetic total: 4 ml  Complexity: complex Blunt dissection to break up loculations  Drainage: purulent, sebaceous   Drainage amount: Moderate   Packing material: 1/4 in iodoform gauze  Patient tolerance: Patient tolerated the procedure well with no immediate complications.    Labs Review Labs Reviewed - No data to display Imaging Review No results found.   EKG Interpretation None      MDM   Final diagnoses:  Infected sebaceous cyst    Patient with infected sebaceous cyst. Amenable to incision and drainage.  Was unable to obtain cyst wall.  Patient was flushed and packed with iodoform gauze.  We'll have the patient followup in 2 days.  Patient is to begin Bactrim.The patient appears reasonably screened and/or stabilized for discharge and I doubt any other medical condition or other [facility] requiring further screening, evaluation, or treatment in the ED at this time prior to discharge.     Arthor Captain, PA-C 06/25/13 [address] PA-C 06/25/13 1206

## 2013-06-25 NOTE — Discharge Instructions (Signed)
Followup with your doctor or an urgent care in order to remove your packing in 48-72 hours. You may return to the emergency department if you have  a fever that persists greater than 101 or your abscess appears to become infected (growing surrounding redness and warmth). Do not operate any heavy machinery while on pain medications. Do not consume alcohol on these medications either. ° °Abscess °An abscess (boil or furuncle) is an infected area that contains a collection of pus.  °SYMPTOMS °Signs and symptoms of an abscess include pain, tenderness, redness, or hardness. You may feel a moveable soft area under your skin. An abscess can occur anywhere in the body.  °TREATMENT  °A surgical cut (incision) may be made over your abscess to drain the pus. Gauze may be packed into the space or a drain may be looped through the abscess cavity (pocket). This provides a drain that will allow the cavity to heal from the inside outwards. The abscess may be painful for a few days, but should feel much better if it was drained.  °Your abscess, if seen early, may not have localized and may not have been drained. If not, another appointment may be required if it does not get better on its own or with medications. °HOME CARE INSTRUCTIONS  °· Only take over-the-counter or prescription medicines for pain, discomfort, or fever as directed by your caregiver.  °· Take your antibiotics as directed if they were prescribed. Finish them even if you start to feel better.  °· Keep the skin and clothes clean around your abscess.  °· If the abscess was drained, you will need to use gauze dressing to collect any draining pus. Dressings will typically need to be changed 3 or more times a day.  °· The infection may spread by skin contact with others. Avoid skin contact as much as possible.  °· Practice good hygiene. This includes regular hand washing, cover any draining skin lesions, and do not share personal care items.  °· If you participate in  sports, do not share athletic equipment, towels, whirlpools, or personal care items. Shower after every practice or tournament.  °· If a draining area cannot be adequately covered:  °· Do not participate in sports.  °· Children should not participate in day care until the wound has healed or drainage stops.  °· If your caregiver has given you a follow-up appointment, it is very important to keep that appointment. Not keeping the appointment could result in a much worse infection, chronic or permanent injury, pain, and disability. If there is any problem keeping the appointment, you must call back to this facility for assistance.  °SEEK MEDICAL CARE IF:  °· You develop increased pain, swelling, redness, drainage, or bleeding in the wound site.  °· You develop signs of generalized infection including muscle aches, chills, fever, or a general ill feeling.  °· You have an oral temperature above 102° F (38.9° C).  °MAKE SURE YOU:  °· Understand these instructions.  °· Will watch your condition.  °· Will get help right away if you are not doing well or get worse.  °Document Released: 01/22/2005 Document Revised: 12/25/2010 Document Reviewed: 11/16/2007 °[employer]® Patient Information ©2012 [employer]. ° ° °

## 2013-06-25 NOTE — ED Notes (Signed)
Tender ,swollen abscess in l/axilla. Approx 2cm x 1cm. raised area noted.Pt reported growth x 4 weeks with increased pain and tenderness x 1 week.Seen by PCP yesterday, bactrim ordered

## 2013-06-26 NOTE — ED Provider Notes (Signed)
Medical screening examination/treatment/procedure(s) were performed by non-physician practitioner and as supervising physician I was immediately available for consultation/collaboration.   EKG Interpretation None        Nashton Belson T Colum Colt, MD 06/26/13 0704 

## 2014-06-29 ENCOUNTER — Encounter (HOSPITAL_COMMUNITY): Payer: Self-pay

## 2014-06-29 ENCOUNTER — Emergency Department (HOSPITAL_COMMUNITY)
Admission: EM | Admit: 2014-06-29 | Discharge: 2014-06-29 | Disposition: A | Discharge status: Home / Self Care | Payer: Self-pay | Attending: Emergency Medicine | Admitting: Emergency Medicine

## 2014-06-29 DIAGNOSIS — R112 Nausea with vomiting, unspecified: Secondary | ICD-10-CM | POA: Insufficient documentation

## 2014-06-29 DIAGNOSIS — B20 Human immunodeficiency virus [HIV] disease: Secondary | ICD-10-CM | POA: Insufficient documentation

## 2014-06-29 DIAGNOSIS — M791 Myalgia: Secondary | ICD-10-CM | POA: Insufficient documentation

## 2014-06-29 DIAGNOSIS — R531 Weakness: Secondary | ICD-10-CM | POA: Insufficient documentation

## 2014-06-29 DIAGNOSIS — R51 Headache: Secondary | ICD-10-CM | POA: Insufficient documentation

## 2014-06-29 DIAGNOSIS — Z720 Tobacco use: Secondary | ICD-10-CM | POA: Insufficient documentation

## 2014-06-29 DIAGNOSIS — R6889 Other general symptoms and signs: Secondary | ICD-10-CM

## 2014-06-29 DIAGNOSIS — Z79899 Other long term (current) drug therapy: Secondary | ICD-10-CM | POA: Insufficient documentation

## 2014-06-29 MED ORDER — ONDANSETRON HCL 4 MG PO TABS: 4.0000 mg | ORAL_TABLET | Freq: Four times a day (QID) | ORAL | Status: AC

## 2014-06-29 NOTE — Discharge Instructions (Signed)
TAKE MEDICATIONS AS PRESCRIBED. PUSH FLUIDS. TYLENOL AND/OR IBUPROFEN FOR ACHES. GET PLENTY OF REST AND FOLLOW UP WITH YOUR DOCTOR FOR RECHECK IN 1-2 DAYS.

## 2014-06-29 NOTE — ED Notes (Addendum)
Patient reports feeling weakness since yesterday and a right-sided headache since this morning.  Reports he had missed a couple doses of his HIV medication prior to feeling bad.  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

## 2014-06-29 NOTE — ED Provider Notes (Signed)
CSN: [id number]     Arrival date & time 06/29/14  1937 History  This chart was scribed for non-physician practitioner, Arnoldo HookerShari A Chadd Tollison, PA-C,  working with Richardean Canalavid H Yao, MD, by Modena JanskyAlbert Thayil, ED Scribe. This patient was seen in room WTR7/WTR7 and the patient's care was started at 9:04 PM.   Chief Complaint  Patient presents with  . Weakness   The history is provided by the patient. No language interpreter was used.   HPI Comments: Leon Hall is a 39 y.o. male who presents to the Emergency Department complaining of constant moderate weakness that started yesterday. He reports that he has been feeling weak since yesterday, and this morning started having a constant moderate right-sided headache. He states that he had 2 episodes of vomiting yesterday and today, with nausea right before. He describes yesterday's emesis as having food contents and today's having clear mucous. He states that he has forgotten to take some of his HIV medication doses over the past weekend but has resumed regular use. He reports having some fatigue, chills, and generalized body aches. He denies any diarrhea.   Past Medical History  Diagnosis Date  . HIV disease    Past Surgical History  Procedure Laterality Date  . Ankle surgery      RT ankle   Family History  Problem Relation Age of Onset  . Hypertension Mother   . Diabetes Mother    History  Substance Use Topics  . Smoking status: Current Every Day Smoker -- 0.20 packs/day    Types: Cigarettes  . Smokeless tobacco: Not on file  . Alcohol Use: Yes     Comment: occasionally    Review of Systems  Constitutional: Positive for chills and fatigue. Negative for fever.  HENT: Negative for congestion and sore throat.   Respiratory: Negative for cough and shortness of breath.   Gastrointestinal: Positive for nausea and vomiting. Negative for diarrhea.  Musculoskeletal: Positive for myalgias (generalized). Negative for neck stiffness.  Skin: Negative for rash.   Neurological: Positive for weakness and headaches.   Allergies  Shellfish allergy  Home Medications   Prior to Admission medications   Medication Sig Start Date End Date Taking? Authorizing Provider  albuterol (PROVENTIL HFA;VENTOLIN HFA) 108 (90 BASE) MCG/ACT inhaler Inhale 1-2 puffs into the lungs every 6 (six) hours as needed for wheezing. 08/06/12   Garlon HatchetLisa M Sanders, PA-C  Aspirin-Salicylamide-Caffeine (BC HEADACHE POWDER PO) Take 1 packet by mouth daily as needed (for pain.).    Historical Provider, MD  efavirenz-emtrictabine-tenofovir (ATRIPLA) 600-200-300 MG per tablet Take 1 tablet by mouth daily.     Historical Provider, MD  HYDROcodone-acetaminophen (NORCO) 5-325 MG per tablet Take 1 tablet by mouth every 4 (four) hours as needed. 06/25/13   Arthor CaptainAbigail Harris, PA-C  valACYclovir (VALTREX) 1000 MG tablet Take 1,000 mg by mouth daily.     Historical Provider, MD   BP 142/92 mmHg  Pulse 91  Temp(Src) 98.1 F (36.7 C) (Oral)  Resp 18  Ht 5\' 9"  (1.753 m)  Wt 150 lb (68.04 kg)  BMI 22.14 kg/m2  SpO2 100% Physical Exam  Constitutional: He is oriented to person, place, and time. He appears well-developed and well-nourished. No distress.  HENT:  Head: Normocephalic and atraumatic.  Mouth/Throat: Oropharynx is clear and moist.  Neck: Normal range of motion. Neck supple. No tracheal deviation present.  Cardiovascular: Normal rate, regular rhythm, normal heart sounds and intact distal pulses.  Exam reveals no gallop and no friction rub.  No murmur heard. Pulmonary/Chest: Effort normal and breath sounds normal. No respiratory distress. He has no wheezes. He has no rales.  Abdominal: Soft. Bowel sounds are normal. There is no tenderness. There is no rebound and no guarding.  Musculoskeletal: Normal range of motion.  Neurological: He is alert and oriented to person, place, and time. He has normal reflexes.  Skin: Skin is warm and dry. No rash noted.  Psychiatric: He has a normal mood and  affect. His behavior is normal.  Nursing note and vitals reviewed.   ED Course  Procedures (including critical care time) DIAGNOSTIC STUDIES: Oxygen Saturation is 100% on RA, normal by my interpretation.    COORDINATION OF CARE: 9:08 PM- Pt advised of plan for treatment and pt agrees.  Labs Review Labs Reviewed - No data to display  Imaging Review No results found.   EKG Interpretation None      MDM   Final diagnoses:  None    1. Flu-like illness  Patient is well appearing with normal VS. Suggested supportive care, Zofran if needed, PCP follow up.  I personally performed the services described in this documentation, which was scribed in my presence. The recorded information has been reviewed and is accurate.      Arnoldo Hooker, PA-C 06/29/14 2136  Richardean Canal, MD 06/29/14 2322

## 2014-08-09 ENCOUNTER — Emergency Department (HOSPITAL_COMMUNITY)
Admission: EM | Admit: 2014-08-09 | Discharge: 2014-08-09 | Disposition: A | Discharge status: Home / Self Care | Payer: Self-pay | Attending: Emergency Medicine | Admitting: Emergency Medicine

## 2014-08-09 ENCOUNTER — Encounter (HOSPITAL_COMMUNITY): Payer: Self-pay

## 2014-08-09 DIAGNOSIS — Z79899 Other long term (current) drug therapy: Secondary | ICD-10-CM | POA: Insufficient documentation

## 2014-08-09 DIAGNOSIS — X58XXXA Exposure to other specified factors, initial encounter: Secondary | ICD-10-CM | POA: Insufficient documentation

## 2014-08-09 DIAGNOSIS — Y9389 Activity, other specified: Secondary | ICD-10-CM | POA: Insufficient documentation

## 2014-08-09 DIAGNOSIS — H109 Unspecified conjunctivitis: Secondary | ICD-10-CM

## 2014-08-09 DIAGNOSIS — T1592XA Foreign body on external eye, part unspecified, left eye, initial encounter: Secondary | ICD-10-CM

## 2014-08-09 DIAGNOSIS — Y9289 Other specified places as the place of occurrence of the external cause: Secondary | ICD-10-CM | POA: Insufficient documentation

## 2014-08-09 DIAGNOSIS — B20 Human immunodeficiency virus [HIV] disease: Secondary | ICD-10-CM | POA: Insufficient documentation

## 2014-08-09 DIAGNOSIS — Y998 Other external cause status: Secondary | ICD-10-CM | POA: Insufficient documentation

## 2014-08-09 DIAGNOSIS — S00252A Superficial foreign body of left eyelid and periocular area, initial encounter: Secondary | ICD-10-CM | POA: Insufficient documentation

## 2014-08-09 DIAGNOSIS — Z720 Tobacco use: Secondary | ICD-10-CM | POA: Insufficient documentation

## 2014-08-09 MED ORDER — FLUORESCEIN SODIUM 1 MG OP STRP
1.0000 | ORAL_STRIP | Freq: Once | OPHTHALMIC | Status: AC
  Administered 2014-08-09: 1 via OPHTHALMIC
  Filled 2014-08-09: qty 1

## 2014-08-09 MED ORDER — KETOROLAC TROMETHAMINE 0.5 % OP SOLN: 1.0000 [drp] | Freq: Four times a day (QID) | OPHTHALMIC | Status: AC

## 2014-08-09 MED ORDER — TETRACAINE HCL 0.5 % OP SOLN
2.0000 [drp] | Freq: Once | OPHTHALMIC | Status: AC
  Administered 2014-08-09: 2 [drp] via OPHTHALMIC
  Filled 2014-08-09: qty 2

## 2014-08-09 MED ORDER — ERYTHROMYCIN 5 MG/GM OP OINT: TOPICAL_OINTMENT | OPHTHALMIC | Status: AC

## 2014-08-09 NOTE — Discharge Instructions (Signed)
Bacterial Conjunctivitis °Bacterial conjunctivitis, commonly called pink eye, is an inflammation of the clear membrane that covers the white part of the eye (conjunctiva). The inflammation can also happen on the underside of the eyelids. The blood vessels in the conjunctiva become inflamed, causing the eye to become red or pink. Bacterial conjunctivitis may spread easily from one eye to another and from person to person (contagious).  °CAUSES  °Bacterial conjunctivitis is caused by bacteria. The bacteria may come from your own skin, your upper respiratory tract, or from someone else with bacterial conjunctivitis. °SYMPTOMS  °The normally white color of the eye or the underside of the eyelid is usually pink or red. The pink eye is usually associated with irritation, tearing, and some sensitivity to light. Bacterial conjunctivitis is often associated with a thick, yellowish discharge from the eye. The discharge may turn into a crust on the eyelids overnight, which causes your eyelids to stick together. If a discharge is present, there may also be some blurred vision in the affected eye. °DIAGNOSIS  °Bacterial conjunctivitis is diagnosed by your caregiver through an eye exam and the symptoms that you report. Your caregiver looks for changes in the surface tissues of your eyes, which may point to the specific type of conjunctivitis. A sample of any discharge may be collected on a cotton-tip swab if you have a severe case of conjunctivitis, if your cornea is affected, or if you keep getting repeat infections that do not respond to treatment. The sample will be sent to a lab to see if the inflammation is caused by a bacterial infection and to see if the infection will respond to antibiotic medicines. °TREATMENT  °· Bacterial conjunctivitis is treated with antibiotics. Antibiotic eyedrops are most often used. However, antibiotic ointments are also available. Antibiotics pills are sometimes used. Artificial tears or eye  washes may ease discomfort. °HOME CARE INSTRUCTIONS  °· To ease discomfort, apply a cool, clean washcloth to your eye for 10-20 minutes, 3-4 times a day. °· Gently wipe away any drainage from your eye with a warm, wet washcloth or a cotton ball. °· Wash your hands often with soap and water. Use paper towels to dry your hands. °· Do not share towels or washcloths. This may spread the infection. °· Change or wash your pillowcase every day. °· You should not use eye makeup until the infection is gone. °· Do not operate machinery or drive if your vision is blurred. °· Stop using contact lenses. Ask your caregiver how to sterilize or replace your contacts before using them again. This depends on the type of contact lenses that you use. °· When applying medicine to the infected eye, do not touch the edge of your eyelid with the eyedrop bottle or ointment tube. °SEEK IMMEDIATE MEDICAL CARE IF:  °· Your infection has not improved within 3 days after beginning treatment. °· You had yellow discharge from your eye and it returns. °· You have increased eye pain. °· Your eye redness is spreading. °· Your vision becomes blurred. °· You have a fever or persistent symptoms for more than 2-3 days. °· You have a fever and your symptoms suddenly get worse. °· You have facial pain, redness, or swelling. °MAKE SURE YOU:  °· Understand these instructions. °· Will watch your condition. °· Will get help right away if you are not doing well or get worse. °Document Released: 04/14/2005 Document Revised: 08/29/2013 Document Reviewed: 09/15/2011 °ExitCare® Patient Information ©2015 [employer]. This information is not intended to   replace advice given to you by your health care provider. Make sure you discuss any questions you have with your health care provider.  Eye, Foreign Body The term foreign body refers to any object near, on the surface of or in the eye that should not be there. A foreign body may be a small speck of dirt or dust,  a hair or eyelash, a splinter or any object. CAUSES  Foreign bodies can get in the eye by:  Flying pieces of something that was broken or destroyed (debris).  A sudden injury (trauma) to the eye. SYMPTOMS  Symptoms depend on what the foreign body is and where it is in the eye. The most common locations are:  On the inner surface of the upper or lower eyelids or on the covering of the white part of the eye (conjunctiva). Symptoms in this location are:  Irritating and painful, especially when blinking.  Feeling like something is in the eye.  On the surface of the clear covering on the front of the eye (cornea). A corneal foreign body has symptoms that:  Are painful and irritating since the cornea is very sensitive.  Form small "rust rings" around a metallic foreign body. Metallic foreign bodies stick more firmly to the surface of the cornea.  Inside the eyeball. Infection can happen fast and can be hard to treat with antibiotics. This is an extremely dangerous situation. Foreign bodies inside the eye can threaten vision. A person may even loose their eye. Foreign bodies inside the eye may cause:  Great pain.  Immediate loss of vision. DIAGNOSIS  Foreign bodies are found during an exam by an eye specialist. Those that are on the eyelids, conjunctiva or cornea are usually (but not always) easily found. When a foreign body is inside the eyeball, a cataract may form almost right away. This makes it hard for an ophthalmologist to find the foreign body. Special tests may be needed, including ultrasound testing, X-rays and CT scans. TREATMENT   Foreign bodies that are on the eyelids, conjunctiva or cornea are often removed easily and painlessly.  If the foreign body has caused a scratch or abrasion of the cornea, antibiotic drops, ointments and/or a tight patch called a "pressure patch" may be needed. Follow-up exams will be needed for several days until the abrasion heals.  Surgery is  needed right away if the foreign body is inside the eyeball. This is a medical emergency. An antibiotic therapy will likely be given to stop an infection. HOME CARE INSTRUCTIONS  The use of eye patches is not universal. Their use varies from state to state and from caregiver to caregiver. If an eye patch was applied:  Keep the eye patch on for as long as directed by your caregiver until the follow-up appointment.  Do not remove the patch to put in medications unless instructed to do so. When replacing the patch, retape it as it was before. Follow the same procedure if the patch becomes loose.  WARNING: Do not drive or operate machinery while the eye is patched. The ability to judge distances will be impaired.  Only take over-the-counter or prescription medicines for pain, discomfort or fever as directed by the caregiver. If no eye patch was applied:  Keep the eye closed as much as possible. Do not rub the eye.  Wear dark glasses as needed to protect the eyes from bright light.  Do not wear contact lenses until the eye feels normal again, or as instructed.  Wear  protective eye covering if there is a risk of eye injury. This is important when working with high speed tools.  Only take over-the-counter or prescription medicines for pain, discomfort or fever as directed by the caregiver. SEEK IMMEDIATE MEDICAL CARE IF:   Pain increases in the eye or the vision changes.  You or your child has problems with the eye patch.  The injury to the eye appears to be getting larger.  There is discharge from the injured eye.  Swelling and/or soreness (inflammation) develops around the affected eye.  You or your child has an oral temperature above 102 F (38.9 C), not controlled by medicine.  Your baby is older than 3 months with a rectal temperature of 102 F (38.9 C) or higher.  Your baby is 363 months old or younger with a rectal temperature of 100.4 F (38 C) or higher. MAKE SURE YOU:    Understand these instructions.  Will watch your condition.  Will get help right away if you are not doing well or get worse. Document Released: 04/14/2005 Document Revised: 07/07/2011 Document Reviewed: 09/09/2012 [facility] Patient Information 2015 [place], [place]. This information is not intended to replace advice given to you by your health care provider. Make sure you discuss any questions you have with your health care provider.

## 2014-08-09 NOTE — ED Provider Notes (Signed)
CSN: [id number]     Arrival date & time 08/09/14  1313 History  This chart was scribed for a non-physician practitioner, Leon Peliffany Jaylon Grode, PA-C working with Leon BilisKevin Campos, MD by Leon Hall, ED Scribe. The patient was seen in [facility]. The patient's care was started at 2:12 PM.    Chief Complaint  Patient presents with  . Eye Pain      Patient is a 39 y.o. male presenting with eye pain. The history is provided by the patient. No language interpreter was used.  Eye Pain    HPI Comments: Leon Hall is a 39 y.o. male who presents to the Emergency Department complaining sudden onset of left eye  itchiness x 2 days that pt noticed after waking up one morning. He also complains of increased light sensitivity. Pt reports that he feels as if something is "stuck in his eye". He states he has tried washing out eye with water and eye drops without relief. Pt is current everyday smoker. Denies loss of vision, swelling or pain with moving his eye. Denies any trauma.   Past Medical History  Diagnosis Date  . HIV disease    Past Surgical History  Procedure Laterality Date  . Ankle surgery      RT ankle   Family History  Problem Relation Age of Onset  . Hypertension Mother   . Diabetes Mother    History  Substance Use Topics  . Smoking status: Current Every Day Smoker -- 0.20 packs/day    Types: Cigarettes  . Smokeless tobacco: Not on file  . Alcohol Use: Yes     Comment: occasionally    Review of Systems  Eyes: Positive for itching. Negative for pain.       Increased light sensitivity.   All other systems reviewed and are negative.     Allergies  Shellfish allergy  Home Medications   Prior to Admission medications   Medication Sig Start Date End Date Taking? Authorizing Provider  acetaminophen (TYLENOL) 500 MG tablet Take 500 mg by mouth every 6 (six) hours as needed for headache.    Historical Provider, MD  albuterol (PROVENTIL HFA;VENTOLIN HFA) 108 (90 BASE) MCG/ACT  inhaler Inhale 1-2 puffs into the lungs every 6 (six) hours as needed for wheezing. 08/06/12   Leon HatchetLisa M Sanders, PA-C  Aspirin-Salicylamide-Caffeine (BC HEADACHE POWDER PO) Take 1 packet by mouth daily as needed (for pain.).    Historical Provider, MD  efavirenz-emtrictabine-tenofovir (ATRIPLA) 600-200-300 MG per tablet Take 1 tablet by mouth daily.     Historical Provider, MD  erythromycin ophthalmic ointment Place a 1/2 inch ribbon of ointment into the lower eyelid BID for 7 days 08/09/14   Leon Peliffany Franko Hilliker, PA-C  HYDROcodone-acetaminophen (NORCO) 5-325 MG per tablet Take 1 tablet by mouth every 4 (four) hours as needed. Patient not taking: Reported on 06/29/2014 06/25/13   Leon CaptainAbigail Harris, PA-C  ketorolac (ACULAR) 0.5 % ophthalmic solution Place 1 drop into both eyes every 6 (six) hours. 08/09/14   Leon Irby Neva SeatGreene, PA-C  ondansetron (ZOFRAN) 4 MG tablet Take 1 tablet (4 mg total) by mouth every 6 (six) hours. 06/29/14   Leon AnisShari Upstill, PA-C  valACYclovir (VALTREX) 1000 MG tablet Take 1,000 mg by mouth daily.     Historical Provider, MD   BP 133/89 mmHg  Pulse 83  Temp(Src) 98.1 F (36.7 C) (Oral)  Resp 18  SpO2 100% Physical Exam  Constitutional: He is oriented to person, place, and time. He appears well-developed and well-nourished. No distress.  HENT:  Head: Normocephalic and atraumatic.  Eyes: EOM and lids are normal. Right eye exhibits no discharge and no exudate. Left eye exhibits no discharge and no exudate. Right conjunctiva is not injected. Right conjunctiva has no hemorrhage. Left conjunctiva is injected. Left conjunctiva has no hemorrhage. Left eye exhibits normal extraocular motion and no nystagmus. Left pupil is round and reactive.  Woods lamp revealed foreign body (dog hair) to left lower eyelid. NO corneal abrasion.  Neck: Neck supple. No tracheal deviation present.  Cardiovascular: Normal rate.   Pulmonary/Chest: Effort normal. No respiratory distress.  Musculoskeletal: Normal range of  motion.  Neurological: He is alert and oriented to person, place, and time.  Skin: Skin is warm and dry.  Psychiatric: He has a normal mood and affect. His behavior is normal.  Nursing note and vitals reviewed.   ED Course  Procedures (including critical care time) Labs Review Labs Reviewed - No data to display  Imaging Review No results found.   EKG Interpretation None     Medications  fluorescein ophthalmic strip 1 strip (1 strip Both Eyes Given by Other 08/09/14 1358)  tetracaine (PONTOCAINE) 0.5 % ophthalmic solution 2 drop (2 drops Both Eyes Given by Other 08/09/14 1401)   2:17 PM- Treatment plan was discussed with patient who verbalizes understanding and agrees.   MDM   Final diagnoses:  Foreign body in eye, left, initial encounter  Conjunctivitis of left eye    Referral to Ophthalmology- rx erythromycin and ketorolac drops.   39 y.o.Leon Hall's evaluation in the Emergency Department is complete. It has been determined that no acute conditions requiring further emergency intervention are present at this time. The patient/guardian have been advised of the diagnosis and plan. We have discussed signs and symptoms that warrant return to the ED, such as changes or worsening in symptoms.  Vital signs are stable at discharge. Filed Vitals:   08/09/14 1323  BP: 133/89  Pulse: 83  Temp: 98.1 F (36.7 C)  Resp: 18    Patient/guardian has voiced understanding and agreed to follow-up with the PCP or specialist.  I personally performed the services described in this documentation, which was scribed in my presence. The recorded information has been reviewed and is accurate.   Leon Pel, PA-C 08/09/14 1451  Leon Bilis, MD 08/09/14 [phone]

## 2014-08-09 NOTE — ED Notes (Signed)
Per pt, itchy watery eyes x 2 days.  Painful with keeping open and exposure to light.

## 2014-08-10 ENCOUNTER — Emergency Department (HOSPITAL_COMMUNITY): Payer: Self-pay

## 2014-08-10 ENCOUNTER — Emergency Department (HOSPITAL_COMMUNITY)
Admission: EM | Admit: 2014-08-10 | Discharge: 2014-08-10 | Disposition: A | Discharge status: Home / Self Care | Payer: Self-pay | Attending: Emergency Medicine | Admitting: Emergency Medicine

## 2014-08-10 ENCOUNTER — Encounter (HOSPITAL_COMMUNITY): Payer: Self-pay | Admitting: Emergency Medicine

## 2014-08-10 DIAGNOSIS — S3992XA Unspecified injury of lower back, initial encounter: Secondary | ICD-10-CM | POA: Insufficient documentation

## 2014-08-10 DIAGNOSIS — Z79899 Other long term (current) drug therapy: Secondary | ICD-10-CM | POA: Insufficient documentation

## 2014-08-10 DIAGNOSIS — S4992XA Unspecified injury of left shoulder and upper arm, initial encounter: Secondary | ICD-10-CM | POA: Insufficient documentation

## 2014-08-10 DIAGNOSIS — M25512 Pain in left shoulder: Secondary | ICD-10-CM

## 2014-08-10 DIAGNOSIS — S8002XA Contusion of left knee, initial encounter: Secondary | ICD-10-CM | POA: Insufficient documentation

## 2014-08-10 DIAGNOSIS — Z720 Tobacco use: Secondary | ICD-10-CM | POA: Insufficient documentation

## 2014-08-10 DIAGNOSIS — Y92410 Unspecified street and highway as the place of occurrence of the external cause: Secondary | ICD-10-CM | POA: Insufficient documentation

## 2014-08-10 DIAGNOSIS — Z21 Asymptomatic human immunodeficiency virus [HIV] infection status: Secondary | ICD-10-CM | POA: Insufficient documentation

## 2014-08-10 DIAGNOSIS — Y9389 Activity, other specified: Secondary | ICD-10-CM | POA: Insufficient documentation

## 2014-08-10 DIAGNOSIS — Y998 Other external cause status: Secondary | ICD-10-CM | POA: Insufficient documentation

## 2014-08-10 DIAGNOSIS — S199XXA Unspecified injury of neck, initial encounter: Secondary | ICD-10-CM | POA: Insufficient documentation

## 2014-08-10 MED ORDER — IBUPROFEN 200 MG PO TABS
600.0000 mg | ORAL_TABLET | Freq: Once | ORAL | Status: AC
  Administered 2014-08-10: 600 mg via ORAL
  Filled 2014-08-10: qty 3

## 2014-08-10 MED ORDER — IBUPROFEN 200 MG PO TABS: 400.0000 mg | ORAL_TABLET | Freq: Three times a day (TID) | ORAL | Status: AC | PRN

## 2014-08-10 NOTE — ED Notes (Signed)
Pt requests "x-ray of my collarbone and my shoulder" while pointing to left shoulder/collarbone area. MD Wofford aware. To put in shoulder x-ray.
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
## 2014-08-10 NOTE — Discharge Instructions (Signed)

## 2014-08-10 NOTE — ED Notes (Signed)
Patient transported to X-ray 
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
## 2016-07-20 ENCOUNTER — Encounter (HOSPITAL_COMMUNITY): Payer: Self-pay | Admitting: Emergency Medicine

## 2016-07-20 ENCOUNTER — Emergency Department (HOSPITAL_COMMUNITY)
Admission: EM | Admit: 2016-07-20 | Discharge: 2016-07-20 | Disposition: A | Discharge status: Home / Self Care | Payer: BLUE CROSS/BLUE SHIELD | Attending: Emergency Medicine | Admitting: Emergency Medicine

## 2016-07-20 DIAGNOSIS — Z7982 Long term (current) use of aspirin: Secondary | ICD-10-CM | POA: Diagnosis not present

## 2016-07-20 DIAGNOSIS — H1011 Acute atopic conjunctivitis, right eye: Secondary | ICD-10-CM | POA: Diagnosis not present

## 2016-07-20 DIAGNOSIS — F17210 Nicotine dependence, cigarettes, uncomplicated: Secondary | ICD-10-CM | POA: Insufficient documentation

## 2016-07-20 DIAGNOSIS — H578 Other specified disorders of eye and adnexa: Secondary | ICD-10-CM | POA: Diagnosis present

## 2016-07-20 MED ORDER — NAPHAZOLINE-PHENIRAMINE 0.025-0.3 % OP SOLN: 1.0000 [drp] | Freq: Four times a day (QID) | OPHTHALMIC | 0 refills | Status: AC | PRN

## 2016-07-20 MED ORDER — LORATADINE 10 MG PO TABS: 10.0000 mg | ORAL_TABLET | Freq: Every day | ORAL | 0 refills | Status: AC
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
## 2016-07-20 NOTE — ED Notes (Signed)
See EDP note. 

## 2016-07-20 NOTE — ED Provider Notes (Signed)
[facility]-EMERGENCY DEPT Provider Note   CSN: [id number] Arrival date & time: 07/20/16  1841   By signing my name below, I, Leon Hall, attest that this documentation has been prepared under the direction and in the presence of [name], PA-C Electronically Signed: Soijett Hall, ED Scribe. 07/20/16. 7:16 PM.  History   Chief Complaint Chief Complaint  Patient presents with  . Eye Problem    HPI  Leon Hall is a 41 y.o. male with a PMHx of HIV, who presents to the Emergency Department complaining of right eye problem onset today. Pt reports associated right eye redness, mild right eye pain, right eye itching, and right eye watering. Pt has tried his boss's abx eye drops with mild relief of his symptoms. He states that he has had to have his right eye flushed completed last summer. He notes that his boss recently had pink eye 1-2 months ago. Denies eye crusting, eye drainage, fever, chills, and any other symptoms.  The history is provided by the patient. No language interpreter was used.    Past Medical History:  Diagnosis Date  . HIV disease (HCC)     There are no active problems to display for this patient.   Past Surgical History:  Procedure Laterality Date  . ANKLE SURGERY     RT ankle       Home Medications    Prior to Admission medications   Medication Sig Start Date End Date Taking? Authorizing Provider  acetaminophen (TYLENOL) 500 MG tablet Take 500 mg by mouth every 6 (six) hours as needed for headache.    Historical Provider, MD  albuterol (PROVENTIL HFA;VENTOLIN HFA) 108 (90 BASE) MCG/ACT inhaler Inhale 1-2 puffs into the lungs every 6 (six) hours as needed for wheezing. 08/06/12   Garlon HatchetLisa M Sanders, PA-C  Aspirin-Salicylamide-Caffeine (BC HEADACHE POWDER PO) Take 1 packet by mouth daily as needed (for pain.).    Historical Provider, MD  efavirenz-emtrictabine-tenofovir (ATRIPLA) 600-200-300 MG per tablet Take 1 tablet by mouth daily.     Historical Provider, MD    erythromycin ophthalmic ointment Place a 1/2 inch ribbon of ointment into the lower eyelid BID for 7 days Patient not taking: Reported on 08/10/2014 08/09/14   [name], PA-C  HYDROcodone-acetaminophen (NORCO) 5-325 MG per tablet Take 1 tablet by mouth every 4 (four) hours as needed. Patient not taking: Reported on 06/29/2014 06/25/13   Arthor CaptainAbigail Harris, PA-C  ketorolac (ACULAR) 0.5 % ophthalmic solution Place 1 drop into both eyes every 6 (six) hours. Patient not taking: Reported on 08/10/2014 08/09/14   [name], PA-C  Multiple Vitamins-Minerals (MULTIVITAMIN PO) Take 1 tablet by mouth daily.    Historical Provider, MD  ondansetron (ZOFRAN) 4 MG tablet Take 1 tablet (4 mg total) by mouth every 6 (six) hours. 06/29/14   [name], PA-C  valACYclovir (VALTREX) 1000 MG tablet Take 1,000 mg by mouth daily.     Historical Provider, MD    Family History Family History  Problem Relation Age of Onset  . Hypertension Mother   . Diabetes Mother     Social History Social History  Substance Use Topics  . Smoking status: Current Every Day Smoker    Packs/day: 0.20    Types: Cigarettes  . Smokeless tobacco: Never Used  . Alcohol use Yes     Comment: occasionally     Allergies   Shellfish allergy   Review of Systems Review of Systems  Constitutional: Negative for chills and fever.  Eyes: Positive for  pain (right), redness (right) and itching (right). Negative for discharge.     Physical Exam Updated Vital Signs BP (!) 144/85 (BP Location: Right Arm)   Pulse 80   Temp 98.4 F (36.9 C) (Oral)   Resp 16   SpO2 96%   Physical Exam  Constitutional: He is oriented to person, place, and time. He appears well-developed and well-nourished. No distress.  HENT:  Head: Normocephalic and atraumatic.  Eyes: EOM are normal. Pupils are equal, round, and reactive to light. Right conjunctiva is not injected.  Right eye without injected sclera. No periorbital erythema or swelling. No  drainage or discharge. No crusting. EOM intact. PERRL.  Neck: Neck supple.  Cardiovascular: Normal rate.   Pulmonary/Chest: Effort normal. No respiratory distress.  Abdominal: He exhibits no distension.  Musculoskeletal: Normal range of motion.  Neurological: He is alert and oriented to person, place, and time.  Skin: Skin is warm and dry.  Psychiatric: He has a normal mood and affect. His behavior is normal.  Nursing note and vitals reviewed.    ED Treatments / Results  DIAGNOSTIC STUDIES: Oxygen Saturation is 96% on RA, nl by my interpretation.    COORDINATION OF CARE: 7:13 PM Discussed treatment plan with pt at bedside which includes visine-A and claritin and pt agreed to plan.   Procedures Procedures (including critical care time)  Medications Ordered in ED Medications - No data to display   Initial Impression / Assessment and Plan / ED Course  I have reviewed the triage vital signs and the nursing notes.  I have reviewed the relevant previous healthcare records. I obtained HPI from historian.  ED Course:  Assessment: Patient is a 41 y.o. male who presents to the ED with symptoms consistent with allergic conjunctivitis.  No evidence of corneal abrasions, entrapment, consensual photophobia, or herpes keratitis.  Presentation not concerning for iritis, or corneal abrasions.  Pt discharged with Visine-A and claritin.  Personal hygiene and frequent handwashing discussed.  Patient advised to follow up with PCP if symptoms persist or worsen. Return precautions discussed.  Patient verbalizes understanding and is agreeable with discharge.  Disposition/Plan:  DC home Additional Verbal discharge instructions given and discussed with patient.  Pt Instructed to f/u with PCP in the next week for evaluation and treatment of symptoms. Return precautions given Pt acknowledges and agrees with plan  Supervising Physician Shaune Pollack, MD  Final Clinical Impressions(s) / ED Diagnoses    Final diagnoses:  Allergic conjunctivitis of right eye    New Prescriptions New Prescriptions   No medications on file   I personally performed the services described in this documentation, which was scribed in my presence. The recorded information has been reviewed and is accurate.    Audry Pili, PA-C 07/20/16 1930    Shaune Pollack, MD 07/21/16 2049

## 2016-07-20 NOTE — ED Triage Notes (Signed)
Pt c/o watery, pink color eye for past couple days. Today pt was sent home from work due to symptoms. Pt reports his boss had pink eye and gave him her antibiotics.
